# Patient Record
Sex: Female | Born: 1975 | Race: Black or African American | Hispanic: No | Marital: Single | Smoking: Never smoker
Health system: Southern US, Community
[De-identification: ages and names within clinical notes are randomized; demographics above are authoritative.]

## PROBLEM LIST (undated history)

## (undated) DIAGNOSIS — N200 Calculus of kidney: Secondary | ICD-10-CM

## (undated) DIAGNOSIS — A6000 Herpesviral infection of urogenital system, unspecified: Secondary | ICD-10-CM

## (undated) DIAGNOSIS — F419 Anxiety disorder, unspecified: Secondary | ICD-10-CM

## (undated) DIAGNOSIS — I10 Essential (primary) hypertension: Secondary | ICD-10-CM

## (undated) DIAGNOSIS — F319 Bipolar disorder, unspecified: Secondary | ICD-10-CM

## (undated) HISTORY — PX: KIDNEY STONE SURGERY: SHX686

## (undated) HISTORY — PX: OTHER SURGICAL HISTORY: SHX169

## (undated) HISTORY — PX: HERNIA REPAIR: SHX51

## (undated) HISTORY — PX: CHOLECYSTECTOMY: SHX55

---

## 1998-01-09 ENCOUNTER — Emergency Department (HOSPITAL_COMMUNITY): Admission: EM | Admit: 1998-01-09 | Discharge: 1998-01-09 | Payer: Self-pay | Admitting: *Deleted

## 2004-02-29 ENCOUNTER — Emergency Department: Payer: Self-pay | Admitting: Emergency Medicine

## 2007-01-27 ENCOUNTER — Observation Stay (HOSPITAL_COMMUNITY): Admission: EM | Admit: 2007-01-27 | Discharge: 2007-01-28 | Payer: Self-pay | Admitting: Emergency Medicine

## 2007-02-17 ENCOUNTER — Other Ambulatory Visit (HOSPITAL_COMMUNITY): Admission: RE | Admit: 2007-02-17 | Discharge: 2007-05-18 | Payer: Self-pay | Admitting: Psychiatry

## 2007-03-02 ENCOUNTER — Ambulatory Visit: Payer: Self-pay | Admitting: Psychiatry

## 2007-08-24 ENCOUNTER — Emergency Department (HOSPITAL_COMMUNITY): Admission: EM | Admit: 2007-08-24 | Discharge: 2007-08-24 | Payer: Self-pay | Admitting: Emergency Medicine

## 2010-10-09 NOTE — H&P (Signed)
Kristina Hammond, Kristina Hammond NO.:  [id number]   MEDICAL RECORD NO.:  [id number]          PATIENT TYPE:  OBV   LOCATION:  4711                         FACILITY:  [facility]   PHYSICIAN:  Darlin Priestly, MD  DATE OF BIRTH:  1975-11-24   DATE OF ADMISSION:  01/27/2007  DATE OF DISCHARGE:                              HISTORY & PHYSICAL   CHIEF COMPLAINT:  Dizziness and weakness.   HISTORY OF PRESENT ILLNESS:  Ms. Nichter is a 35 year old female who is  admitted to the emergency room today with near syncope.  She has a long  history of palpitations.  She has had some prior workup but it is not  clear, does not sound like she had a history of a catheterization done.  She says her palpitation usually last 1-5 minutes but have been  increasing in frequency.  Today after she got to work she had an episode  of dizziness and tachycardia while sitting at her desk.  This lasted  less than 5 minutes.  Later in the day she had a near-syncopal spell  while walking upstairs.   PAST MEDICAL HISTORY:  Remarkable for bipolar disorder.  She recently  saw Dr. Barbee Shropshire and was put on Cymbalta.  She has a history of anemia  and a workup is in progress.  She is due see Dr. Loreta Ave on August 18.  She  has a history of urogenital herpes.  She has nephrolithiasis.  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  She  has a history of migraines.   CURRENT MEDICATIONS:  1. Relpax p.r.n.  2. Valtrex daily.  3. Nexium daily.  4. Birth control pills.  5. Cymbalta.   She has no known drug allergies.   SOCIAL HISTORY:  She is single.  She works as a Runner, broadcasting/film/video.  She is a  nonsmoker.   FAMILY HISTORY:  Mother died at 76 of an MI.  Father has a history of  heart failure; he is in his 29s.   REVIEW OF SYSTEMS:  Essentially unremarkable except for noted above.   PHYSICAL EXAMINATION:  Blood pressure 138/88 lying; standing, 157/93.  Pulse 85-95, temperature 97.8.  GENERAL:   She is a well-developed, obese African American female in no  acute distress.  HEENT:  Normocephalic.  She does have esotropia on the left.  NECK:  Without bruit or JVD.  CHEST:  Clear to auscultation and percussion.  CARDIAC:  Reveals a regular rate and rhythm without murmur, rub or  gallop.  Normal S1, S2.  ABDOMEN:  Obese, nontender.  EXTREMITIES:  Without edema.  Distal pulses are 3+/4.  NEUROLOGIC:  Exam is grossly intact.  She is awake, alert, oriented, and  cooperative.   EKG shows sinus rhythm without acute changes.   IMPRESSION:  1. Near-syncope and weakness.  2. History of palpitations, rule out arrhythmia.  3. Hypertension, untreated.  4. Family history of coronary disease.  5. History of migraines.  6. History of urogenital herpes  7. History of bipolar disorder.   PLAN:  The patient was seen by  Dr. Jenne Campus and myself in the emergency  room.  We will go ahead and admit her for a 23-hour observation.  She  will need an outpatient event monitor and 2-D echocardiogram with bubble  study.  We also added antihypertensive.      Abelino Derrick, P.A.      Darlin Priestly, MD  Electronically Signed    LKK/MEDQ  D:  01/28/2007  T:  01/28/2007  Job:  [id number]   cc:   Olene Craven, M.D.

## 2010-10-09 NOTE — Discharge Summary (Signed)
NAMEDHAMAR, GREGORY NO.:  [id number]   MEDICAL RECORD NO.:  [id number]          PATIENT TYPE:  OBV   LOCATION:  4711                         FACILITY:  [facility]   PHYSICIAN:  Darlin Priestly, MD  DATE OF BIRTH:  [birth year]   DATE OF ADMISSION:  01/27/2007  DATE OF DISCHARGE:  01/28/2007                               DISCHARGE SUMMARY   DISCHARGE DIAGNOSES:  1. Near syncope.  2. Palpitations.  3. Systolic murmur on exam.  4. Hypertension, uncontrolled.  5. Family history of coronary artery disease.  6. History of migraines.  7. History of bipolar disorder and depression.   HOSPITAL COURSE:  The patient is a 35 year old female who was admitted  to the emergency room with near syncope and palpitations.  She was found  to be hypertensive.  After admission she was found to have a soft  systolic murmur on exam.  She does have a history of migraines, and  there is a suspicion for possible ASD.  She will need an outpatient echo  and bubble study.  The patient was put on calcium blocker for  palpitations and hypertension.  Her initial CK was elevated over 500,  but MB and troponin are negative.  Repeat CK is being obtained.  As long  as these enzymes are negative, she will be discharged home.  Her  elevated CK may be from recent exercise program she has undertaken.  The  patient was admitted to telemetry and observed.  She had no arrhythmia.  She had no further near syncopal spells.  We did stop her Cymbalta  thinking this may have had something to do with it.  It was a new  medication for her.  She will be discharged later today pending her  repeat CK and troponin.   DISCHARGE MEDICATIONS:  1. Valtrex 500 mg a day.  2. Allegra 180 mg a day.  3. Birth control pills as taken at home.  4. Nexium 40 mg a day.  5. Multivitamin with iron.   LABORATORY DATA:  Serum pregnancy was negative.  TSH 1.42.  Initial CK  was 584 with 3 MB and troponin 0.02.  Repeat is  pending.  Liver  functions are normal.  Sodium 138, potassium 4, BUN 3, creatinine 0.74,  glucose 97.  White count 4.9, hemoglobin 10.9, hematocrit 33.9,  platelets 343.   EKG shows sinus rhythm without acute changes.   DISPOSITION:  The patient discharged in stable condition and will follow  up with Dr. Jenne Campus.  She will need an event monitor and an echo with  bubble study.  We did add diltiazem SR 180 at discharge.      Abelino Derrick, P.A.      Darlin Priestly, MD  Electronically Signed    LKK/MEDQ  D:  01/28/2007  T:  01/28/2007  Job:  [id number]   cc:   Olene Craven, M.D.

## 2010-10-09 NOTE — H&P (Signed)
Kristina Hammond, Kristina Hammond NO.:  [id number]   MEDICAL RECORD NO.:  [id number]          PATIENT TYPE:  OBV   LOCATION:  4711                         FACILITY:  [facility]   PHYSICIAN:  Darlin Priestly, MD  DATE OF BIRTH:  1976/05/18   DATE OF ADMISSION:  01/27/2007  DATE OF DISCHARGE:                              HISTORY & PHYSICAL   CHIEF COMPLAINTS:  Weak and dizzy.   HISTORY OF PRESENT ILLNESS:  The patient is a 35 year old female who is  seen in the emergency room for a near-syncopal spell.  She has a long  history of palpitations.  She has apparently had some prior workup but  it does not sound like she had a catheterization in the past.  Her  symptoms usually last 1-5 minutes but have been increasing in frequency.  Today, after she came to work, she had dizziness and tachycardia while  sitting at her desk.  This lasted less than 5 minutes.  Later in day she  had a near-syncopal spell while walking up the stairs.   PAST MEDICAL HISTORY:  Remarkable for bipolar disorder.  She was  recently put on Cymbalta by Dr. Barbee Shropshire.  She has a history of anemia  and a workup is in progress.  She is to see Dr. Loreta Ave on February 12, 2007.  She is a history of urogenital herpes that is treated.  She has a  history of nephrolithiasis.  She had remote abdominal surgery as an  infant.   CURRENT MEDICATIONS:  1. Relpax p.r.n.  2. Valtrex daily.  3. Nexium daily.  4. Cymbalta daily.  5. Birth control pills.   She has no known drug allergies.   SOCIAL HISTORY:  She is single.  She works as a Runner, broadcasting/film/video.  She is a  nonsmoker.   FAMILY HISTORY:  Mother died in her 54s of an MI.  Father has a history  of heart failure in his 59s.   REVIEW OF SYSTEMS:  Essentially unremarkable except for noted above.  She denies any GI bleeding or melena.  She has not had thyroid disease  or diabetes.   PHYSICAL EXAMINATION:  Blood pressure 138/88 lying, standing 157/90;  pulse 85-95, sinus;  temperature 97.8.  GENERAL:  She is a well-developed, obese African American female in no  acute distress.  HEENT:  Normocephalic.  She has esotropia on the left.  NECK:  Without bruit or JVD.  Thyroid is not enlarged.  CHEST:  Clear to auscultation and percussion.  CARDIAC:  Reveals a regular rate and rhythm without obvious murmur, rub  or gallop.  Normal S1, S2.  ABDOMEN:  Nontender, obese.  EXTREMITIES:  Without edema.  Distal pulses are 3+/4.  NEUROLOGIC:  Exam is grossly intact.   EKG shows sinus rhythm without acute changes.   IMPRESSION:  1. Near-syncope and palpitations, rule out arrhythmia.  2. Hypertension, untreated.  3. Family history of coronary disease.  4. History of migraines.  5. History of bipolar disorder.  6. History of urogenital herpes   PLAN:  The patient was seen by Dr. Jenne Campus and  myself in the emergency  room.  She will be admitted for 23-hour observation.  We will go ahead  and start an antihypertensive.  She will need an outpatient  echocardiogram with bubble study and an event monitor.      Abelino Derrick, P.A.      Darlin Priestly, MD  Electronically Signed    LKK/MEDQ  D:  01/28/2007  T:  01/28/2007  Job:  [id number]   cc:   Olene Craven, M.D.

## 2011-02-18 LAB — COMPREHENSIVE METABOLIC PANEL
BUN: 4 — ABNORMAL LOW
CO2: 23
Calcium: 8.9
Chloride: 106
Creatinine, Ser: 0.83
GFR calc Af Amer: >60
GFR calc non Af Amer: >60
Glucose, Bld: 109 — ABNORMAL HIGH
Total Bilirubin: 0.9

## 2011-02-18 LAB — DIFFERENTIAL
Basophils Absolute: 0
Lymphocytes Relative: 19
Lymphs Abs: 1
Neutro Abs: 4.2
Neutrophils Relative %: 78 — ABNORMAL HIGH

## 2011-02-18 LAB — POCT CARDIAC MARKERS
CKMB, poc: <1 — ABNORMAL LOW
Myoglobin, poc: 37.5
Operator id: 294501
Troponin i, poc: <0.05

## 2011-02-18 LAB — CBC
HCT: 37.2
MCHC: 33.4
MCV: 82.7
RBC: 4.5

## 2011-02-18 LAB — URINALYSIS, ROUTINE W REFLEX MICROSCOPIC
Bilirubin Urine: NEGATIVE
Hgb urine dipstick: NEGATIVE
Ketones, ur: NEGATIVE
Nitrite: NEGATIVE
pH: 5.5

## 2011-02-18 LAB — LIPASE, BLOOD: Lipase: 21

## 2011-03-08 LAB — CBC
HCT: 33.9 — ABNORMAL LOW
Hemoglobin: 10.9 — ABNORMAL LOW
Platelets: 343
RDW: 25.7 — ABNORMAL HIGH
WBC: 4.9

## 2011-03-08 LAB — COMPREHENSIVE METABOLIC PANEL
AST: 30
Albumin: 3.4 — ABNORMAL LOW
BUN: 3 — ABNORMAL LOW
Chloride: 106
Creatinine, Ser: 0.74
GFR calc Af Amer: >60
Total Protein: 7.5

## 2011-03-08 LAB — TROPONIN I: Troponin I: 0.02

## 2011-03-08 LAB — I-STAT 8, (EC8 V) (CONVERTED LAB)
Acid-base deficit: 2
Chloride: 109
Glucose, Bld: 83
Hemoglobin: 13.9
Potassium: 4.7
Sodium: 139
TCO2: 24
pH, Ven: 7.373 — ABNORMAL HIGH

## 2011-03-08 LAB — DIFFERENTIAL
Basophils Relative: 0
Eosinophils Relative: 6 — ABNORMAL HIGH
Lymphocytes Relative: 28
Lymphs Abs: 1.4
Monocytes Relative: 6
Neutro Abs: 2.9

## 2011-03-08 LAB — TSH: TSH: 1.428

## 2011-03-08 LAB — POCT I-STAT CREATININE: Operator id: 146091

## 2011-03-08 LAB — CK TOTAL AND CKMB (NOT AT ARMC)
CK, MB: 1.6
CK, MB: 3
Relative Index: 0.5
Relative Index: 0.6
Total CK: 254 — ABNORMAL HIGH

## 2011-05-30 ENCOUNTER — Emergency Department: Payer: Self-pay | Admitting: Internal Medicine

## 2011-05-30 LAB — URINALYSIS, COMPLETE
Ketone: NEGATIVE
Nitrite: NEGATIVE
Ph: 5 (ref 4.5–8.0)
Protein: 100

## 2011-05-30 LAB — COMPREHENSIVE METABOLIC PANEL
Albumin: 3.5 g/dL (ref 3.4–5.0)
Alkaline Phosphatase: 58 U/L (ref 50–136)
BUN: 8 mg/dL (ref 7–18)
Bilirubin,Total: 1 mg/dL (ref 0.2–1.0)
Creatinine: 0.97 mg/dL (ref 0.60–1.30)
Glucose: 113 mg/dL — ABNORMAL HIGH (ref 65–99)
SGOT(AST): 23 U/L (ref 15–37)
SGPT (ALT): 31 U/L
Total Protein: 8.6 g/dL — ABNORMAL HIGH (ref 6.4–8.2)

## 2011-05-30 LAB — PREGNANCY, URINE: Pregnancy Test, Urine: NEGATIVE m[IU]/mL

## 2011-05-30 LAB — CBC
HCT: 37.6 % (ref 35.0–47.0)
HGB: 12 g/dL (ref 12.0–16.0)
MCH: 25.3 pg — ABNORMAL LOW (ref 26.0–34.0)
MCHC: 31.9 g/dL — ABNORMAL LOW (ref 32.0–36.0)
MCV: 79 fL — ABNORMAL LOW (ref 80–100)

## 2011-06-29 ENCOUNTER — Emergency Department: Payer: Self-pay | Admitting: Emergency Medicine

## 2011-06-29 LAB — PREGNANCY, URINE: Pregnancy Test, Urine: NEGATIVE m[IU]/mL

## 2011-07-08 ENCOUNTER — Emergency Department: Payer: Self-pay | Admitting: Emergency Medicine

## 2011-07-08 LAB — TSH: Thyroid Stimulating Horm: 0.61 u[IU]/mL

## 2011-07-08 LAB — CBC
HCT: 31.7 % — ABNORMAL LOW (ref 35.0–47.0)
HGB: 10.2 g/dL — ABNORMAL LOW (ref 12.0–16.0)
MCHC: 32.2 g/dL (ref 32.0–36.0)
Platelet: 197 10*3/uL (ref 150–440)
RDW: 16.1 % — ABNORMAL HIGH (ref 11.5–14.5)
WBC: 3.1 10*3/uL — ABNORMAL LOW (ref 3.6–11.0)

## 2011-07-08 LAB — COMPREHENSIVE METABOLIC PANEL
Albumin: 3.5 g/dL (ref 3.4–5.0)
BUN: 6 mg/dL — ABNORMAL LOW (ref 7–18)
Chloride: 105 mmol/L (ref 98–107)
Creatinine: 0.77 mg/dL (ref 0.60–1.30)
EGFR (African American): >60
Glucose: 88 mg/dL (ref 65–99)
SGOT(AST): 25 U/L (ref 15–37)
SGPT (ALT): 23 U/L
Total Protein: 7.5 g/dL (ref 6.4–8.2)

## 2011-07-08 LAB — DRUG SCREEN, URINE
Amphetamines, Ur Screen: NEGATIVE (ref ?–1000)
Barbiturates, Ur Screen: NEGATIVE (ref ?–200)
Benzodiazepine, Ur Scrn: NEGATIVE (ref ?–200)
Cannabinoid 50 Ng, Ur ~~LOC~~: NEGATIVE (ref ?–50)
Cocaine Metabolite,Ur ~~LOC~~: NEGATIVE (ref ?–300)
MDMA (Ecstasy)Ur Screen: NEGATIVE (ref ?–500)

## 2011-07-08 LAB — URINALYSIS, COMPLETE
Bacteria: NONE SEEN
Glucose,UR: NEGATIVE mg/dL (ref 0–75)
Leukocyte Esterase: NEGATIVE
Nitrite: NEGATIVE
Ph: 6 (ref 4.5–8.0)
RBC,UR: 300 /HPF (ref 0–5)

## 2011-07-08 LAB — WET PREP, GENITAL

## 2011-07-08 LAB — ETHANOL: Ethanol: <3 mg/dL

## 2011-12-26 ENCOUNTER — Emergency Department: Payer: Self-pay | Admitting: Emergency Medicine

## 2011-12-27 LAB — URINALYSIS, COMPLETE
Protein: NEGATIVE
RBC,UR: 4 /HPF (ref 0–5)
Specific Gravity: 1.026 (ref 1.003–1.030)
Squamous Epithelial: 5
WBC UR: 1 /HPF (ref 0–5)

## 2013-10-16 IMAGING — CT CT STONE STUDY
1 of 2 series · 15 of 32 positions shown, 19 images · non-contrast
Comparison: None

REASON FOR EXAM: left flank pain
COMMENTS:

PROCEDURE:     CT  - CT ABDOMEN /PELVIS WO (STONE)  - May 30, 2011  [DATE]
RESULT:     Indication: Left flank pain
TECHNIQUE: Multiple axial images from the lung bases to the symphysis pubis
were obtained without oral and without intravenous contrast.

[Series 2: stone · axial · 0.76mm/px · z∈[-1104,-684]mm · 15 of 158 slices shown, 19 images]
[im 12/158  soft-tissue]
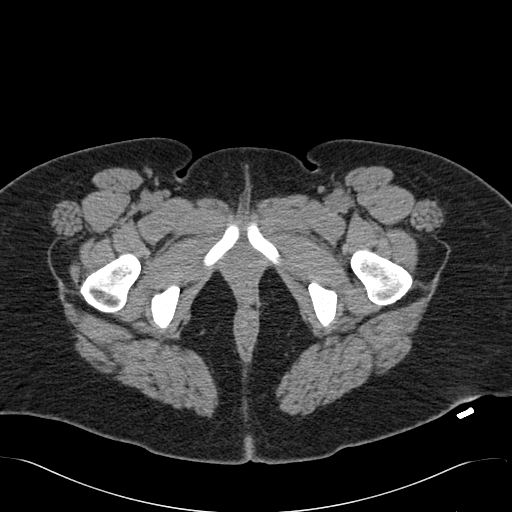
[im 12/158  bone]
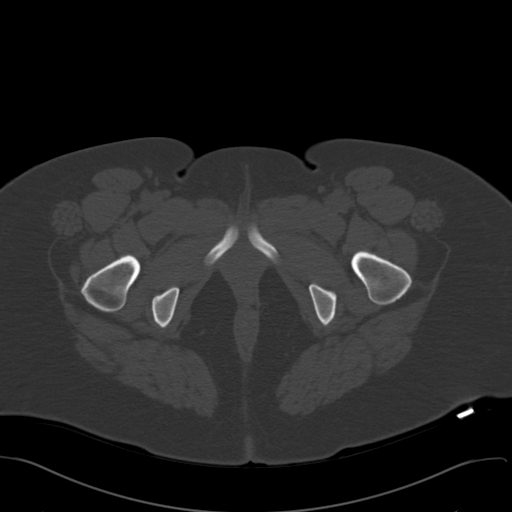
[im 24/158  soft-tissue]
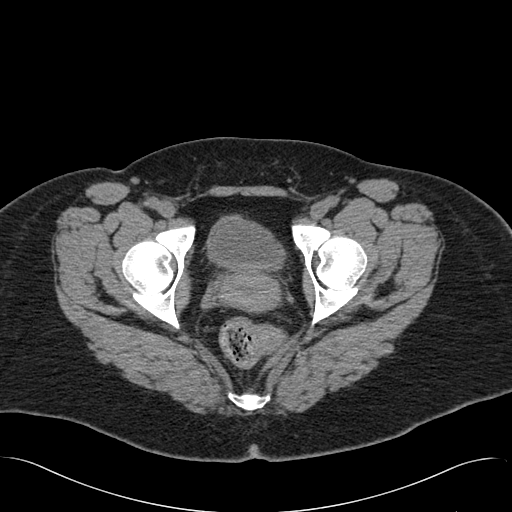
[im 35/158  soft-tissue]
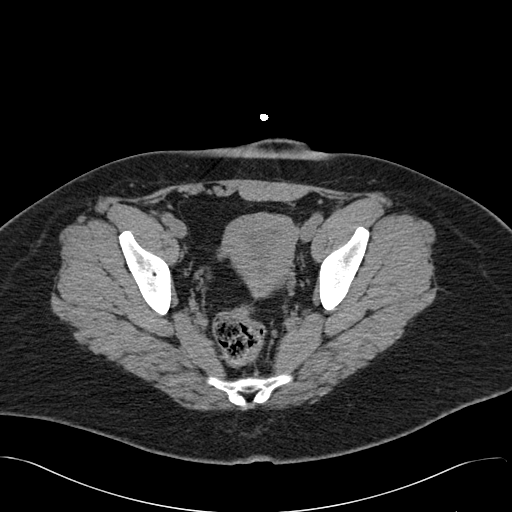
[im 47/158  soft-tissue]
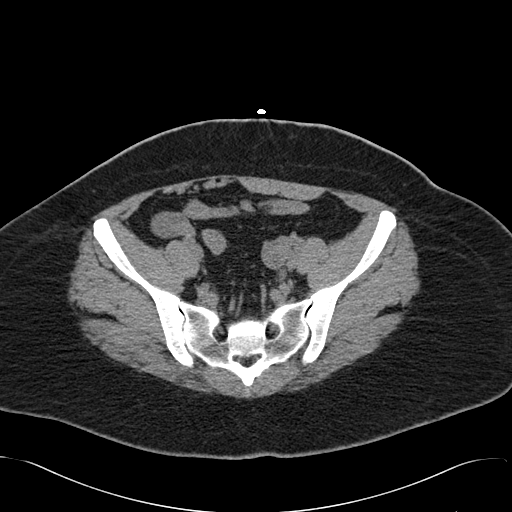
[im 59/158  soft-tissue]
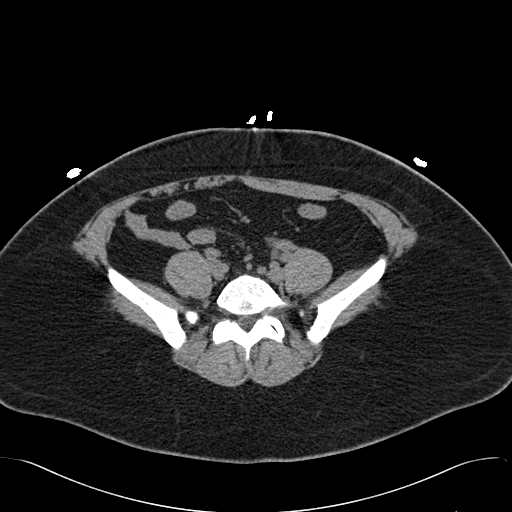
[im 70/158  soft-tissue]
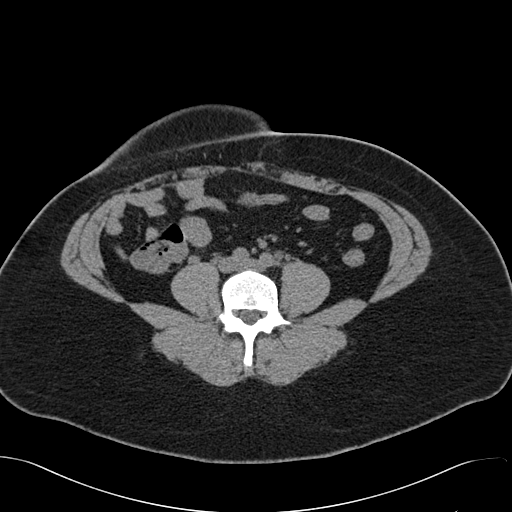
[im 82/158  soft-tissue]
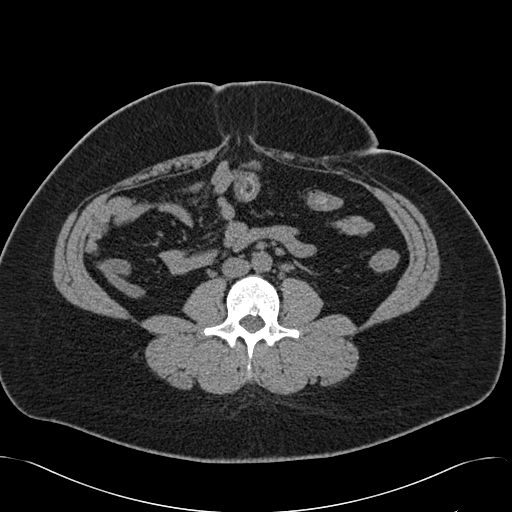
[im 94/158  soft-tissue]
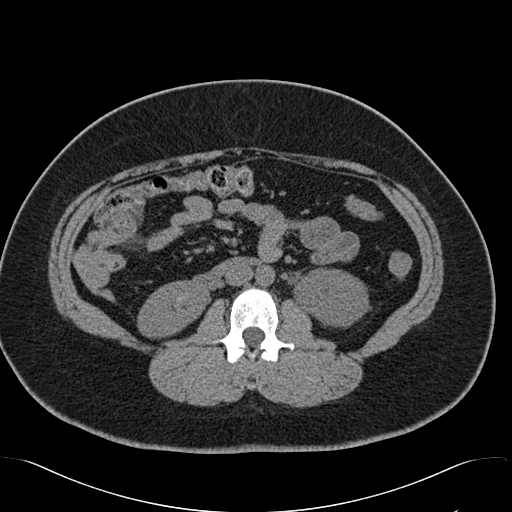
[im 105/158  soft-tissue]
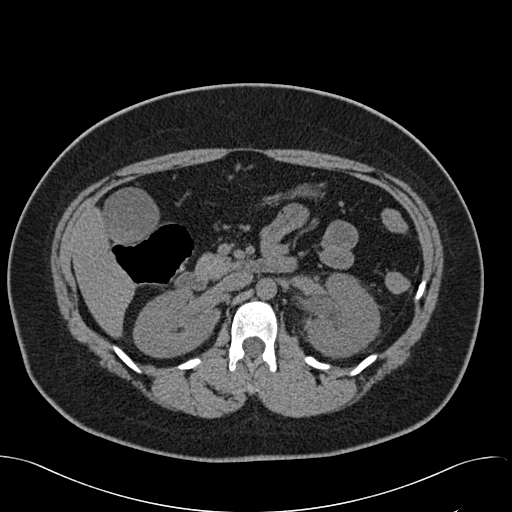
[im 105/158  bone]
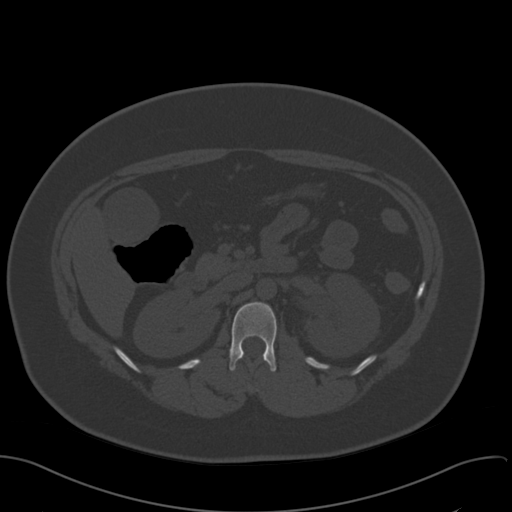
[im 117/158  soft-tissue]
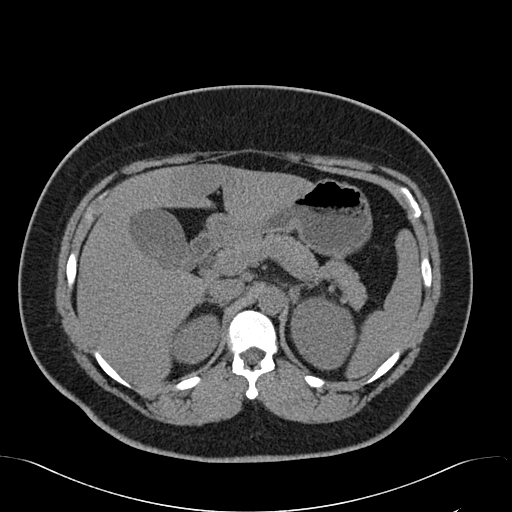
[im 128/158  soft-tissue]
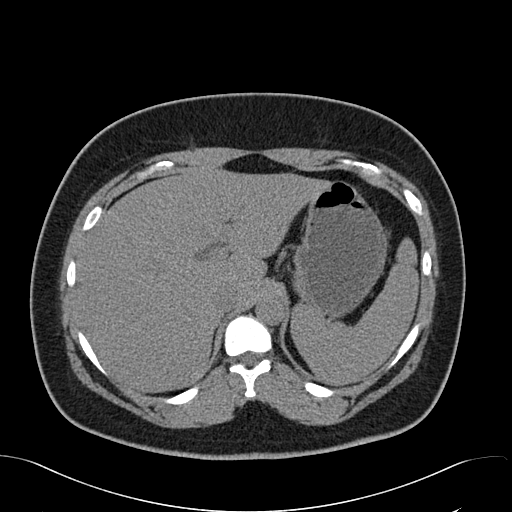
[im 134/158  lung]
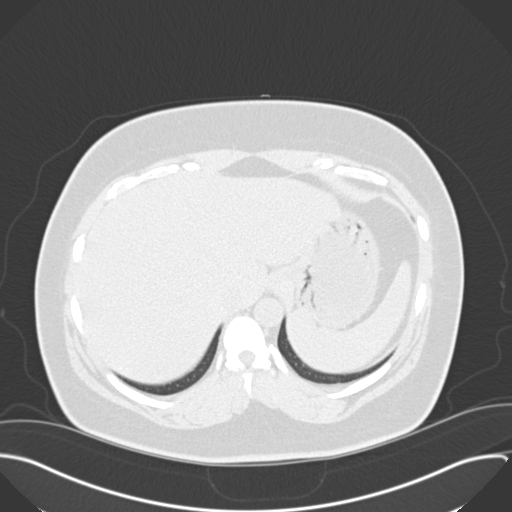
[im 140/158  soft-tissue]
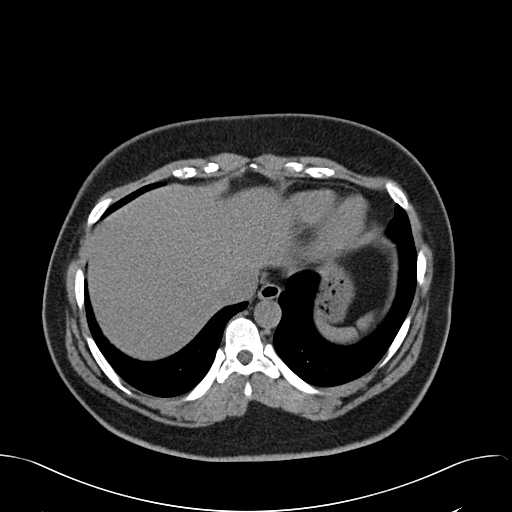
[im 140/158  lung]
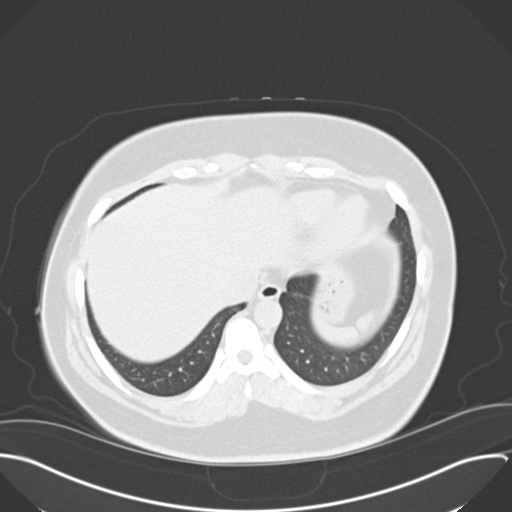
[im 146/158  lung]
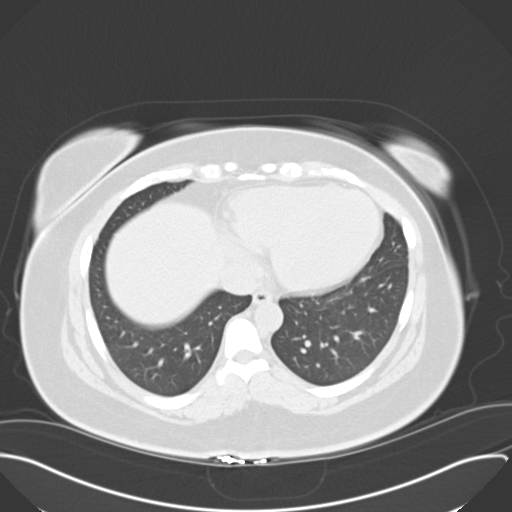
[im 152/158  soft-tissue]
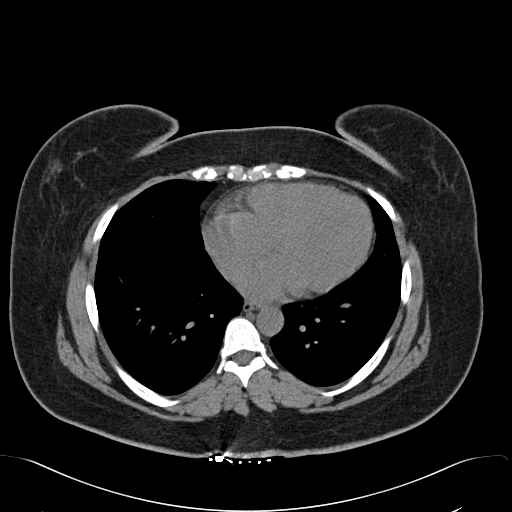
[im 152/158  lung]
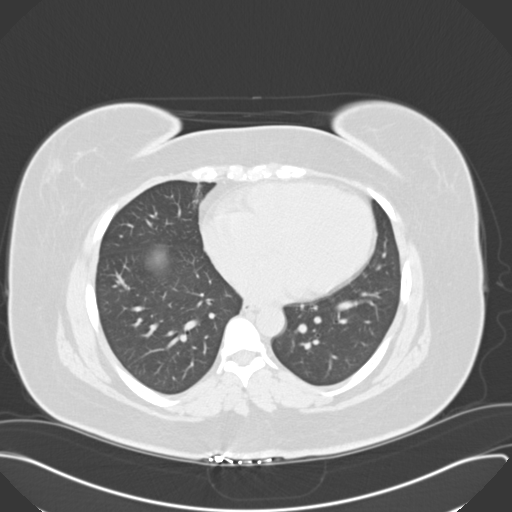

[15 of 32 positions shown; findings below may reference images not displayed]

FINDINGS: The lung bases are clear. There is no pleural or pericardial effusions.

There is a 3-4 mm distal left ureteral calculus resulting in mild left
hydroureteronephrosis. There is a nonobstructing left renal calculus. The
kidneys are symmetric in size without evidence for exophytic mass. The
bladder is unremarkable.

The liver demonstrates no focal abnormality. The gallbladder is
unremarkable. The spleen demonstrates no focal abnormality. The adrenal
glands and pancreas are normal.

There is evidence of prior ventral hernia repair. There is a fat-containing
umbilical hernia. There are 2 metallic foreign bodies in the upper abdomen.
The unopacified stomach, duodenum, small intestine, and large intestine are
unremarkable, but evaluation is limited by lack of oral contrast.  There is
no pneumoperitoneum, pneumatosis, or portal venous gas. There is no
abdominal or pelvic free fluid. There is no lymphadenopathy.

The abdominal aorta is normal in caliber .

The osseous structures are unremarkable.
IMPRESSION: 1. There is a 3-4 mm distal left ureteral calculus resulting in mild left
hydroureteronephrosis.

2. Nonobstructing left renal calculus.

## 2015-05-06 ENCOUNTER — Emergency Department: Payer: BLUE CROSS/BLUE SHIELD

## 2015-05-06 ENCOUNTER — Encounter: Payer: Self-pay | Admitting: Emergency Medicine

## 2015-05-06 ENCOUNTER — Emergency Department
Admission: EM | Admit: 2015-05-06 | Discharge: 2015-05-07 | Disposition: A | Discharge status: Home / Self Care | Payer: BLUE CROSS/BLUE SHIELD | Attending: Emergency Medicine | Admitting: Emergency Medicine

## 2015-05-06 DIAGNOSIS — M549 Dorsalgia, unspecified: Secondary | ICD-10-CM | POA: Insufficient documentation

## 2015-05-06 DIAGNOSIS — J189 Pneumonia, unspecified organism: Secondary | ICD-10-CM | POA: Insufficient documentation

## 2015-05-06 DIAGNOSIS — R079 Chest pain, unspecified: Secondary | ICD-10-CM | POA: Diagnosis present

## 2015-05-06 DIAGNOSIS — I10 Essential (primary) hypertension: Secondary | ICD-10-CM | POA: Diagnosis not present

## 2015-05-06 HISTORY — DX: Herpesviral infection of urogenital system, unspecified: A60.00

## 2015-05-06 HISTORY — DX: Essential (primary) hypertension: I10

## 2015-05-06 HISTORY — DX: Calculus of kidney: N20.0

## 2015-05-06 HISTORY — DX: Anxiety disorder, unspecified: F41.9

## 2015-05-06 HISTORY — DX: Bipolar disorder, unspecified: F31.9

## 2015-05-06 LAB — BASIC METABOLIC PANEL
Anion gap: 10 (ref 5–15)
BUN: 8 mg/dL (ref 6–20)
CALCIUM: 9 mg/dL (ref 8.9–10.3)
CO2: 25 mmol/L (ref 22–32)
CREATININE: 0.7 mg/dL (ref 0.44–1.00)
Chloride: 102 mmol/L (ref 101–111)
GFR calc Af Amer: >60 mL/min (ref >60)
Glucose, Bld: 125 mg/dL — ABNORMAL HIGH (ref 65–99)
POTASSIUM: 3 mmol/L — AB (ref 3.5–5.1)
SODIUM: 137 mmol/L (ref 135–145)

## 2015-05-06 LAB — CBC
HCT: 36.9 % (ref 35.0–47.0)
Hemoglobin: 12.3 g/dL (ref 12.0–16.0)
MCH: 27 pg (ref 26.0–34.0)
MCHC: 33.4 g/dL (ref 32.0–36.0)
MCV: 80.8 fL (ref 80.0–100.0)
PLATELETS: 225 10*3/uL (ref 150–440)
RBC: 4.56 MIL/uL (ref 3.80–5.20)
RDW: 14.2 % (ref 11.5–14.5)
WBC: 6.8 10*3/uL (ref 3.6–11.0)

## 2015-05-06 LAB — TROPONIN I

## 2015-05-06 MED ORDER — IOHEXOL 350 MG/ML SOLN
100.0000 mL | Freq: Once | INTRAVENOUS | Status: AC | PRN
  Administered 2015-05-06: 100 mL via INTRAVENOUS

## 2015-05-06 MED ORDER — HYDROMORPHONE HCL 1 MG/ML IJ SOLN
0.5000 mg | Freq: Once | INTRAMUSCULAR | Status: AC
Start: 2015-05-06 — End: 2015-05-06
  Administered 2015-05-06: 0.5 mg via INTRAVENOUS
  Filled 2015-05-06: qty 1

## 2015-05-06 MED ORDER — AZITHROMYCIN 250 MG PO TABS: ORAL_TABLET | ORAL | Status: AC

## 2015-05-06 MED ORDER — AZITHROMYCIN 250 MG PO TABS
500.0000 mg | ORAL_TABLET | Freq: Once | ORAL | Status: AC
  Administered 2015-05-06: 500 mg via ORAL
  Filled 2015-05-06: qty 2

## 2015-05-06 MED ORDER — HYDROCOD POLST-CPM POLST ER 10-8 MG/5ML PO SUER
5.0000 mL | Freq: Once | ORAL | Status: AC
Start: 2015-05-06 — End: 2015-05-06
  Administered 2015-05-06: 5 mL via ORAL
  Filled 2015-05-06: qty 5

## 2015-05-06 MED ORDER — HYDROCOD POLST-CPM POLST ER 10-8 MG/5ML PO SUER: 5.0000 mL | Freq: Two times a day (BID) | ORAL | Status: AC

## 2015-05-06 NOTE — ED Notes (Signed)
Pt medicated with zithromax - is extremely lethargic from the dilaudid she was given earlier. She drove herself and has no one to come pick her up. Pt will rest before attempting to drive home. tussionex held until discharge since it also has a narcotic in it.

## 2015-05-06 NOTE — ED Notes (Signed)
Pt moved to subwait to await discharge. Pt given her discharge information and signed her discharge.

## 2015-05-06 NOTE — Discharge Instructions (Signed)

## 2015-05-06 NOTE — ED Provider Notes (Signed)
[facility] Emergency Department Provider Note     Time seen: ----------------------------------------- 8:07 PM on 05/06/2015 -----------------------------------------    I have reviewed the triage vital signs and the nursing notes.   HISTORY  Chief Complaint Back Pain and Chest Pain    HPI Kristina Hammond is a 39 y.o. female since ER for right-sided back pain yesterday that has worsened and today. Patient has pain in the right side of her chest, hurts when she breathes, she describes as having chest heaviness, nothing makes her symptoms better. Patient denies fevers or chills, denies any recent cough. Pruritic pain started in a reason.  Past Medical History  Diagnosis Date  . Hypertension   . Anxiety   . Bipolar disorder (HCC)   . Genital herpes   . Kidney stone     There are no active problems to display for this patient.   Past Surgical History  Procedure Laterality Date  . Kidney stone surgery    . Digestive surgery when 3 days old    . Hernia repair    . Cholecystectomy      Allergies Review of patient's allergies indicates no known allergies.  Social History Social History  Substance Use Topics  . Smoking status: Never Smoker   . Smokeless tobacco: None  . Alcohol Use: No    Review of Systems Constitutional: Negative for fever. Eyes: Negative for visual changes. ENT: Negative for sore throat. Cardiovascular:  positive for chest pain Respiratory: Positive for shortness of breath Gastrointestinal: Negative for abdominal pain, vomiting and diarrhea. Genitourinary: Negative for dysuria. Musculoskeletal: Positive for back pain Skin:  Negative for rash. Neurological: Negative for headaches, focal weakness or numbness.  10-point ROS otherwise negative.  ____________________________________________   PHYSICAL EXAM:  VITAL SIGNS: ED Triage Vitals  Enc Vitals Group     BP 05/06/15 1758 120/80 mmHg     Pulse --      Resp  05/06/15 1758 24     Temp 05/06/15 1758 98.6 F (37 C)     Temp Source 05/06/15 1758 Oral     SpO2 05/06/15 1758 96 %     Weight 05/06/15 1758 210 lb (95.255 kg)     Height 05/06/15 1758 4' 11.5" (1.511 m)     Head Cir --      Peak Flow --      Pain Score 05/06/15 1759 8     Pain Loc --      Pain Edu? --      Excl. in GC? --     Constitutional: Alert and oriented. Well appearing and in no distress. Eyes: Conjunctivae are normal. PERRL. Normal extraocular movements. ENT   Head: Normocephalic and atraumatic.   Nose: No congestion/rhinnorhea.   Mouth/Throat: Mucous membranes are moist.   Neck: No stridor. Cardiovascular: Normal rate, regular rhythm. Normal and symmetric distal pulses are present in all extremities. No murmurs, rubs, or gallops. Respiratory: Splinting, diminished breath sounds, lungs are clear. Gastrointestinal: Soft and nontender. No distention. No abdominal bruits.  Musculoskeletal: Nontender with normal range of motion in all extremities. No joint effusions.  No lower extremity tenderness nor edema. Neurologic:  Normal speech and language. No gross focal neurologic deficits are appreciated. Speech is normal. No gait instability. Skin:  Skin is warm, dry and intact. No rash noted. Psychiatric: Mood and affect are normal. Speech and behavior are normal. Patient exhibits appropriate insight and judgment. ____________________________________________  EKG: Interpreted by me. Normal sinus rhythm rate of 99 bpm, normal  PR interval, normal QS with, normal QT interval.  ____________________________________________  ED COURSE:  Pertinent labs & imaging results that were available during my care of the patient were reviewed by me and considered in my medical decision making (see chart for details). Patient's no acute distress, has pleuritic pain of uncertain etiology. Patient may need CT NGO, we'll check cardiac labs and  reevaluate. ____________________________________________    LABS (pertinent positives/negatives)  Labs Reviewed  BASIC METABOLIC PANEL - Abnormal; Notable for the following:    Potassium 3.0 (*)    Glucose, Bld 125 (*)    All other components within normal limits  CBC  TROPONIN I  PREGNANCY, URINE    RADIOLOGY Images were viewed by me  Chest x-ray IMPRESSION: 1. No pulmonary embolus. 2. Trace right pleural effusion. Minimal subsegmental atelectasis and scattered ground-glass opacities in both lower lobes. This is likely infectious or inflammatory.  ____________________________________________  FINAL ASSESSMENT AND PLAN  Pleuritic pain, atypical pneumonia  Plan: Patient with labs and imaging as dictated above. Patient will be discharged with azithromycin, she has received her first dose here. Her labs and vitals are unremarkable, she stable for outpatient follow-up with her doctor.   Emily FilbertWilliams, Salbador Fiveash E, MD   Emily FilbertJonathan E Garron Eline, MD 05/06/15 2119

## 2015-05-06 NOTE — ED Notes (Signed)
Pt began with mid back pain yesterday and then it began radiating to right chest today. Pt also c/o SHOB.

## 2015-05-07 NOTE — ED Notes (Signed)
Pt given ginger ale.  Pt states she is still feeling tired, states she will rest a while longer and check with first nurse before leaving.

## 2017-09-22 IMAGING — CT CT ANGIO CHEST
1 of 2 series · 18 of 30 positions shown · IV contrast (APPLIED)
Comparison: Radiographs earlier this day.

CLINICAL DATA: Chest pain and dyspnea. Mid back pain radiating to
the right chest.

EXAM:
CT ANGIOGRAPHY CHEST WITH CONTRAST
TECHNIQUE: Multidetector CT imaging of the chest was performed using the
standard protocol during bolus administration of intravenous
contrast. Multiplanar CT image reconstructions and MIPs were
obtained to evaluate the vascular anatomy.
CONTRAST:  100mL OMNIPAQUE IOHEXOL 350 MG/ML SOLN

[Series 5: pe 1.0 thins · axial · 0.70mm/px · z∈[+598,+790]mm · 18 of 218 slices shown]
[im 13/218  lung]
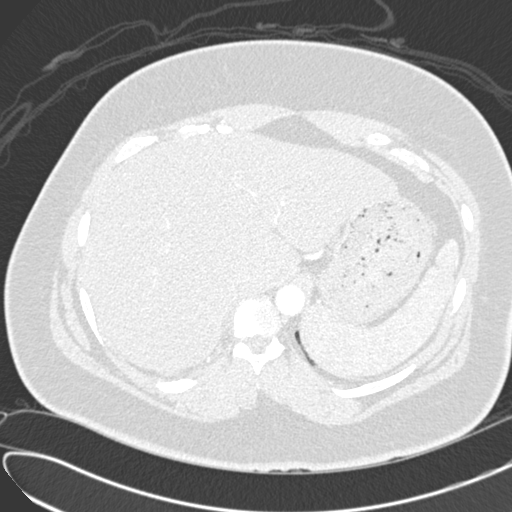
[im 25/218  mediastinal]
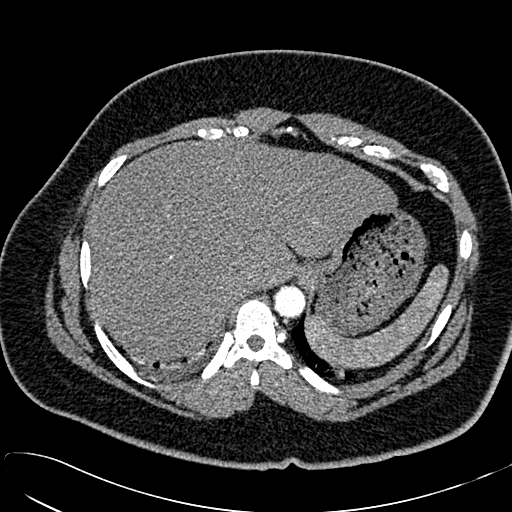
[im 37/218  lung]
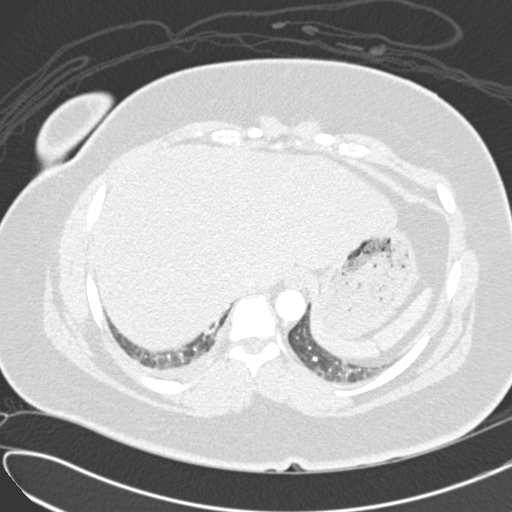
[im 49/218  mediastinal]
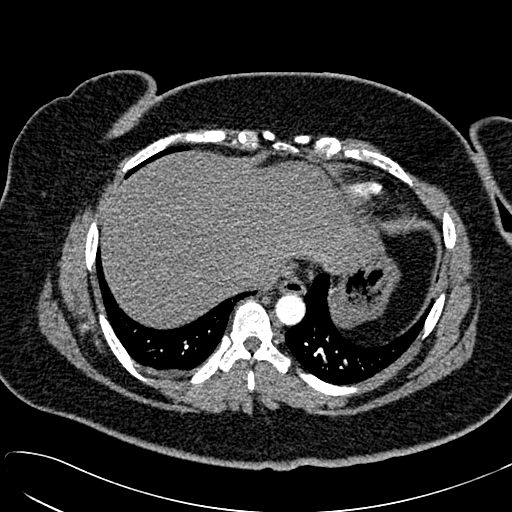
[im 61/218  lung]
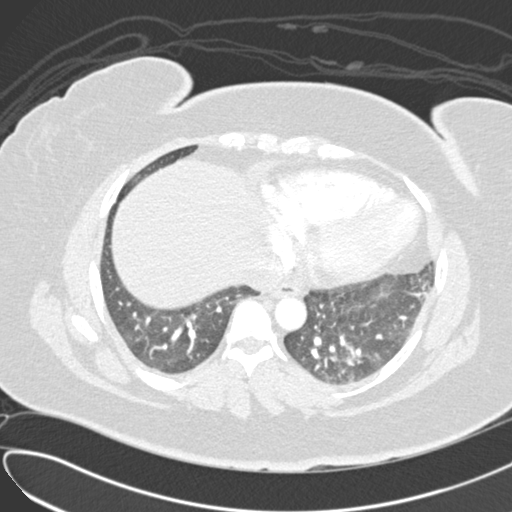
[im 73/218  mediastinal]
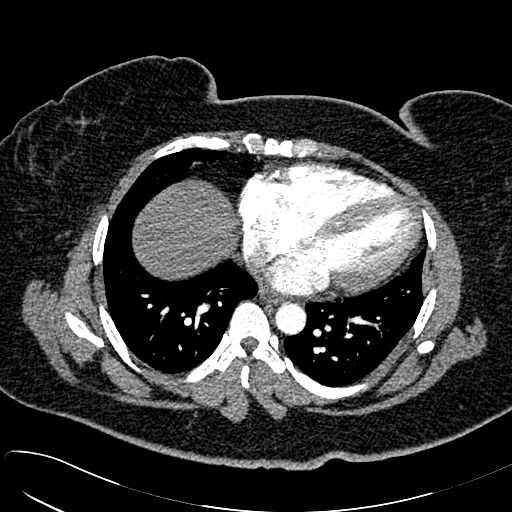
[im 85/218  lung]
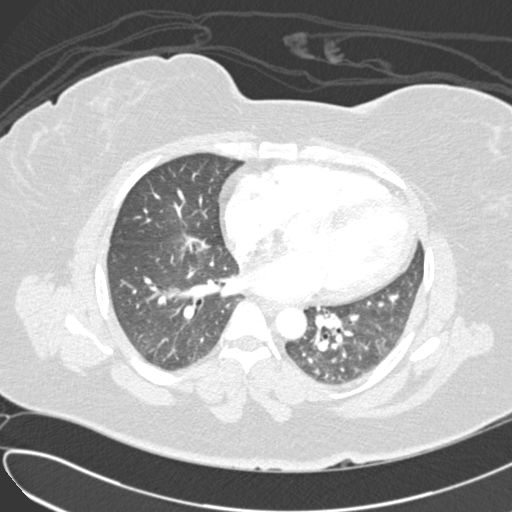
[im 97/218  mediastinal]
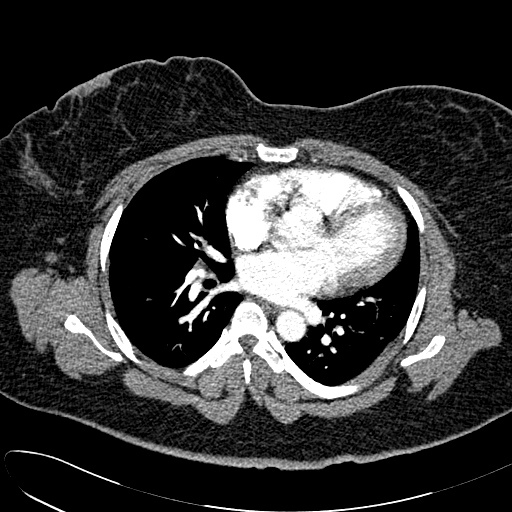
[im 100/218  lung]
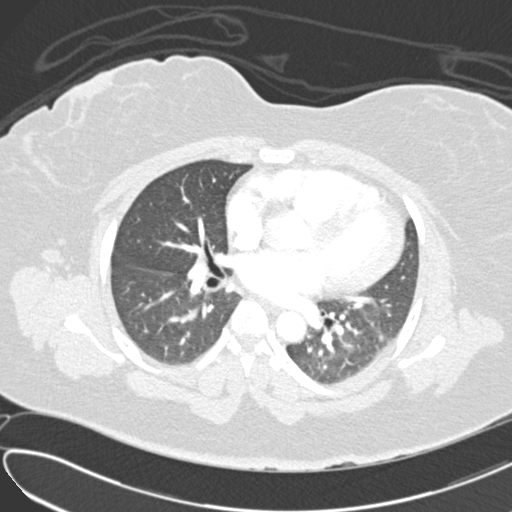
[im 109/218  mediastinal]
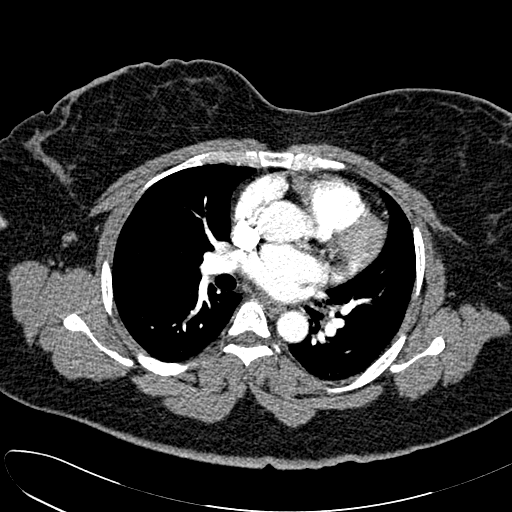
[im 121/218  lung]
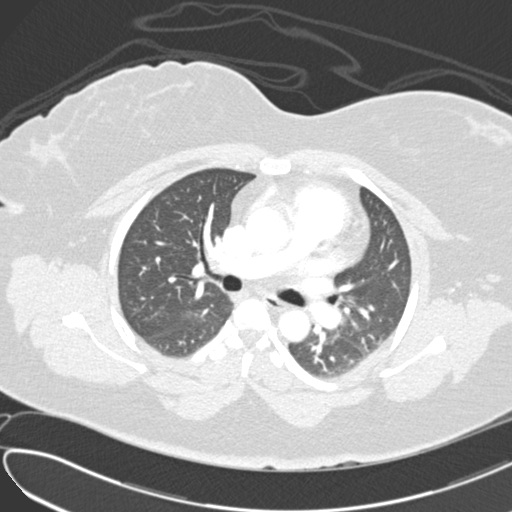
[im 133/218  mediastinal]
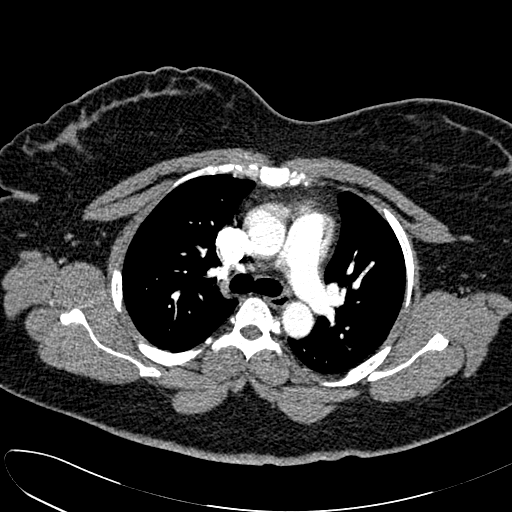
[im 145/218  lung]
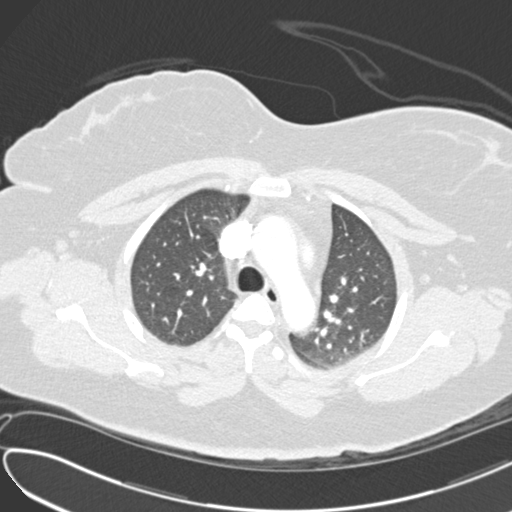
[im 157/218  mediastinal]
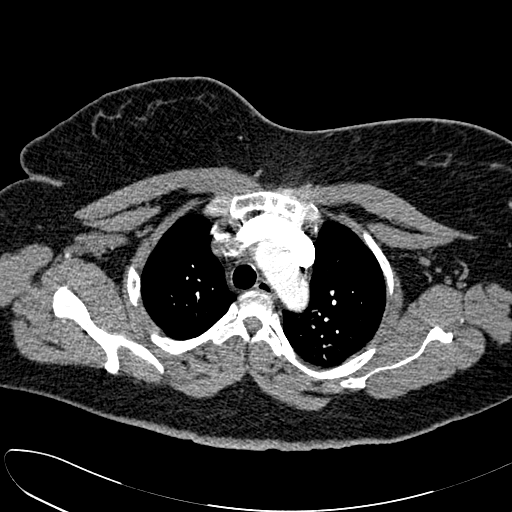
[im 169/218  lung]
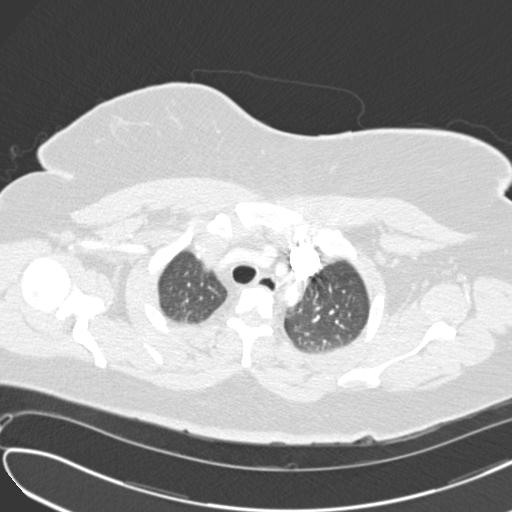
[im 181/218  mediastinal]
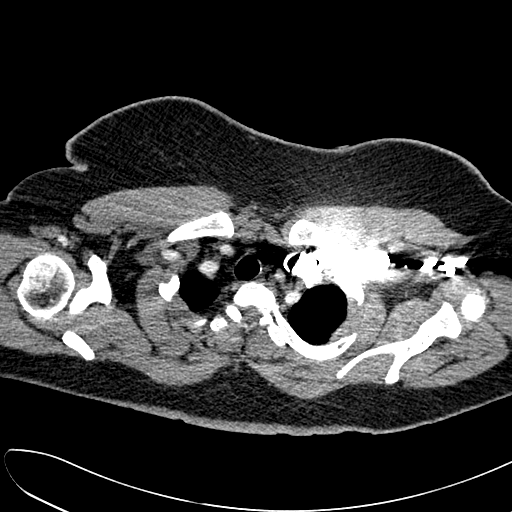
[im 193/218  lung]
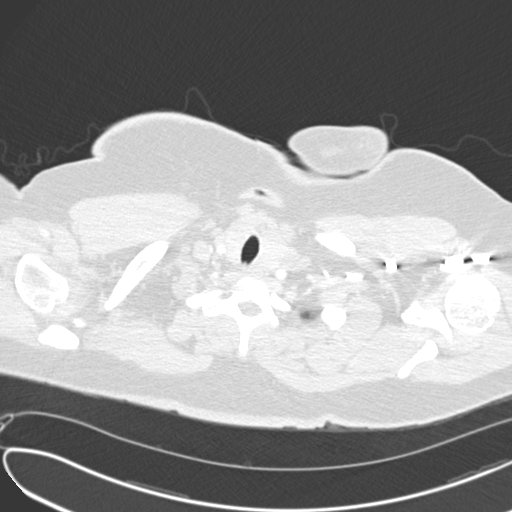
[im 205/218  mediastinal]
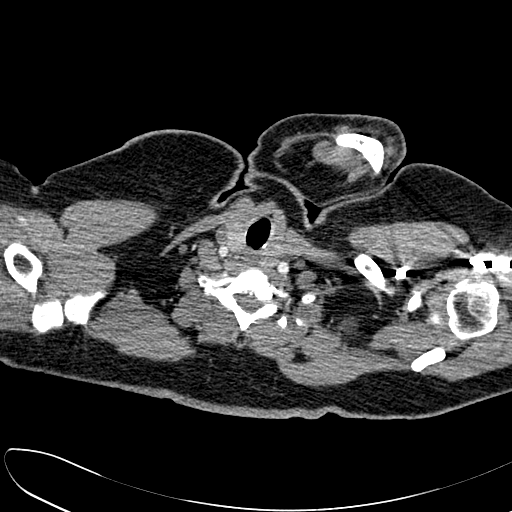

[18 of 30 positions shown; findings below may reference images not displayed]

FINDINGS: There are no filling defects within the pulmonary arteries to
suggest pulmonary embolus.

Thoracic aorta is normal in caliber without evidence of dissection.
No mediastinal or hilar adenopathy. No pericardial effusion. The
esophagus is decompressed.

Trace right pleural effusion. No left pleural effusion. Scattered
linear subsegmental atelectasis and ground-glass opacity in both
lower lobes. Mild lower lobe bronchial thickening. No confluent
airspace disease. No pulmonary mass or suspicious nodule.

No acute abnormality in the included upper abdomen.

There are no acute or suspicious osseous abnormalities.

Review of the MIP images confirms the above findings.
IMPRESSION: 1. No pulmonary embolus.
2. Trace right pleural effusion. Minimal subsegmental atelectasis
and scattered ground-glass opacities in both lower lobes. This is
likely infectious or inflammatory.
